# Patient Record
Sex: Female | Born: 1995 | Marital: Married
Health system: Southern US, Community
[De-identification: ages and names within clinical notes are randomized; demographics above are authoritative.]

## PROBLEM LIST (undated history)

## (undated) DIAGNOSIS — I10 Essential (primary) hypertension: Secondary | ICD-10-CM

## (undated) DIAGNOSIS — F329 Major depressive disorder, single episode, unspecified: Secondary | ICD-10-CM

## (undated) DIAGNOSIS — F32A Depression, unspecified: Secondary | ICD-10-CM

## (undated) DIAGNOSIS — J45909 Unspecified asthma, uncomplicated: Secondary | ICD-10-CM

---
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
## 2017-01-01 DIAGNOSIS — Z30432 Encounter for removal of intrauterine contraceptive device: Secondary | ICD-10-CM | POA: Diagnosis not present

## 2017-03-17 DIAGNOSIS — Z3A01 Less than 8 weeks gestation of pregnancy: Secondary | ICD-10-CM | POA: Diagnosis not present

## 2017-03-17 DIAGNOSIS — O200 Threatened abortion: Secondary | ICD-10-CM | POA: Diagnosis not present

## 2017-03-17 DIAGNOSIS — Z3A00 Weeks of gestation of pregnancy not specified: Secondary | ICD-10-CM | POA: Diagnosis not present

## 2017-03-17 DIAGNOSIS — O209 Hemorrhage in early pregnancy, unspecified: Secondary | ICD-10-CM | POA: Diagnosis not present

## 2017-03-20 DIAGNOSIS — Z3A00 Weeks of gestation of pregnancy not specified: Secondary | ICD-10-CM | POA: Diagnosis not present

## 2017-03-20 DIAGNOSIS — O200 Threatened abortion: Secondary | ICD-10-CM | POA: Diagnosis not present

## 2017-03-27 DIAGNOSIS — Z3A00 Weeks of gestation of pregnancy not specified: Secondary | ICD-10-CM | POA: Diagnosis not present

## 2017-03-27 DIAGNOSIS — O200 Threatened abortion: Secondary | ICD-10-CM | POA: Diagnosis not present

## 2017-05-14 DIAGNOSIS — R112 Nausea with vomiting, unspecified: Secondary | ICD-10-CM | POA: Diagnosis not present

## 2017-05-14 DIAGNOSIS — R350 Frequency of micturition: Secondary | ICD-10-CM | POA: Diagnosis not present

## 2017-05-14 DIAGNOSIS — D200 Benign neoplasm of soft tissue of retroperitoneum: Secondary | ICD-10-CM | POA: Diagnosis not present

## 2017-05-14 DIAGNOSIS — N912 Amenorrhea, unspecified: Secondary | ICD-10-CM | POA: Diagnosis not present

## 2017-05-18 DIAGNOSIS — Z3A01 Less than 8 weeks gestation of pregnancy: Secondary | ICD-10-CM | POA: Diagnosis not present

## 2017-05-18 DIAGNOSIS — Z3689 Encounter for other specified antenatal screening: Secondary | ICD-10-CM | POA: Diagnosis not present

## 2017-05-18 DIAGNOSIS — O3680X0 Pregnancy with inconclusive fetal viability, not applicable or unspecified: Secondary | ICD-10-CM | POA: Diagnosis not present

## 2017-06-01 DIAGNOSIS — Z369 Encounter for antenatal screening, unspecified: Secondary | ICD-10-CM | POA: Diagnosis not present

## 2017-06-01 DIAGNOSIS — N912 Amenorrhea, unspecified: Secondary | ICD-10-CM | POA: Diagnosis not present

## 2017-06-01 DIAGNOSIS — Z3689 Encounter for other specified antenatal screening: Secondary | ICD-10-CM | POA: Diagnosis not present

## 2017-06-01 DIAGNOSIS — Z3A09 9 weeks gestation of pregnancy: Secondary | ICD-10-CM | POA: Diagnosis not present

## 2017-07-27 DIAGNOSIS — Z3A17 17 weeks gestation of pregnancy: Secondary | ICD-10-CM | POA: Diagnosis not present

## 2017-07-27 DIAGNOSIS — N898 Other specified noninflammatory disorders of vagina: Secondary | ICD-10-CM | POA: Diagnosis not present

## 2017-07-27 DIAGNOSIS — Z363 Encounter for antenatal screening for malformations: Secondary | ICD-10-CM | POA: Diagnosis not present

## 2017-09-11 DIAGNOSIS — R509 Fever, unspecified: Secondary | ICD-10-CM | POA: Diagnosis not present

## 2017-09-11 DIAGNOSIS — J020 Streptococcal pharyngitis: Secondary | ICD-10-CM | POA: Diagnosis not present

## 2017-10-05 DIAGNOSIS — O99810 Abnormal glucose complicating pregnancy: Secondary | ICD-10-CM | POA: Diagnosis not present

## 2017-10-05 DIAGNOSIS — Z3A27 27 weeks gestation of pregnancy: Secondary | ICD-10-CM | POA: Diagnosis not present

## 2017-10-05 DIAGNOSIS — R7302 Impaired glucose tolerance (oral): Secondary | ICD-10-CM | POA: Diagnosis not present

## 2017-10-05 DIAGNOSIS — Z3689 Encounter for other specified antenatal screening: Secondary | ICD-10-CM | POA: Diagnosis not present

## 2017-10-05 DIAGNOSIS — Z23 Encounter for immunization: Secondary | ICD-10-CM | POA: Diagnosis not present

## 2017-10-22 DIAGNOSIS — R7302 Impaired glucose tolerance (oral): Secondary | ICD-10-CM | POA: Diagnosis not present

## 2017-11-29 DIAGNOSIS — Z3A34 34 weeks gestation of pregnancy: Secondary | ICD-10-CM | POA: Diagnosis not present

## 2017-11-29 DIAGNOSIS — Z113 Encounter for screening for infections with a predominantly sexual mode of transmission: Secondary | ICD-10-CM | POA: Diagnosis not present

## 2017-11-29 DIAGNOSIS — Z3A35 35 weeks gestation of pregnancy: Secondary | ICD-10-CM | POA: Diagnosis not present

## 2017-11-29 DIAGNOSIS — O24410 Gestational diabetes mellitus in pregnancy, diet controlled: Secondary | ICD-10-CM | POA: Diagnosis not present

## 2017-12-25 DIAGNOSIS — O34211 Maternal care for low transverse scar from previous cesarean delivery: Secondary | ICD-10-CM | POA: Diagnosis not present

## 2017-12-25 DIAGNOSIS — Z3A39 39 weeks gestation of pregnancy: Secondary | ICD-10-CM | POA: Diagnosis not present

## 2017-12-25 DIAGNOSIS — O24420 Gestational diabetes mellitus in childbirth, diet controlled: Secondary | ICD-10-CM | POA: Diagnosis not present

## 2018-01-28 DIAGNOSIS — D485 Neoplasm of uncertain behavior of skin: Secondary | ICD-10-CM | POA: Diagnosis not present

## 2018-01-28 DIAGNOSIS — L820 Inflamed seborrheic keratosis: Secondary | ICD-10-CM | POA: Diagnosis not present

## 2018-01-28 DIAGNOSIS — L918 Other hypertrophic disorders of the skin: Secondary | ICD-10-CM | POA: Diagnosis not present

## 2018-01-28 DIAGNOSIS — L578 Other skin changes due to chronic exposure to nonionizing radiation: Secondary | ICD-10-CM | POA: Diagnosis not present

## 2018-01-28 DIAGNOSIS — D224 Melanocytic nevi of scalp and neck: Secondary | ICD-10-CM | POA: Diagnosis not present

## 2018-02-25 DIAGNOSIS — L820 Inflamed seborrheic keratosis: Secondary | ICD-10-CM | POA: Diagnosis not present

## 2018-03-13 DIAGNOSIS — Z3202 Encounter for pregnancy test, result negative: Secondary | ICD-10-CM | POA: Diagnosis not present

## 2018-03-13 DIAGNOSIS — Z131 Encounter for screening for diabetes mellitus: Secondary | ICD-10-CM | POA: Diagnosis not present

## 2018-03-13 DIAGNOSIS — Z8632 Personal history of gestational diabetes: Secondary | ICD-10-CM | POA: Diagnosis not present

## 2018-03-13 DIAGNOSIS — Z30430 Encounter for insertion of intrauterine contraceptive device: Secondary | ICD-10-CM | POA: Diagnosis not present

## 2018-05-01 DIAGNOSIS — Z30431 Encounter for routine checking of intrauterine contraceptive device: Secondary | ICD-10-CM | POA: Diagnosis not present

## 2018-11-19 DIAGNOSIS — M79605 Pain in left leg: Secondary | ICD-10-CM | POA: Diagnosis not present

## 2018-11-19 DIAGNOSIS — Z6828 Body mass index (BMI) 28.0-28.9, adult: Secondary | ICD-10-CM | POA: Diagnosis not present

## 2018-11-22 ENCOUNTER — Ambulatory Visit (HOSPITAL_BASED_OUTPATIENT_CLINIC_OR_DEPARTMENT_OTHER): Admission: RE | Admit: 2018-11-22 | Payer: BC Managed Care – PPO | Source: Ambulatory Visit

## 2018-11-22 ENCOUNTER — Other Ambulatory Visit (HOSPITAL_BASED_OUTPATIENT_CLINIC_OR_DEPARTMENT_OTHER): Payer: Self-pay | Admitting: Family

## 2018-11-22 DIAGNOSIS — I83813 Varicose veins of bilateral lower extremities with pain: Secondary | ICD-10-CM

## 2018-11-22 DIAGNOSIS — M79605 Pain in left leg: Secondary | ICD-10-CM

## 2018-12-04 DIAGNOSIS — Z01419 Encounter for gynecological examination (general) (routine) without abnormal findings: Secondary | ICD-10-CM | POA: Diagnosis not present

## 2018-12-04 DIAGNOSIS — F3289 Other specified depressive episodes: Secondary | ICD-10-CM | POA: Diagnosis not present

## 2018-12-06 ENCOUNTER — Other Ambulatory Visit: Payer: Self-pay | Admitting: Obstetrics and Gynecology

## 2018-12-06 DIAGNOSIS — I8312 Varicose veins of left lower extremity with inflammation: Secondary | ICD-10-CM

## 2018-12-06 DIAGNOSIS — I8311 Varicose veins of right lower extremity with inflammation: Secondary | ICD-10-CM

## 2018-12-26 ENCOUNTER — Other Ambulatory Visit: Payer: BC Managed Care – PPO

## 2018-12-26 ENCOUNTER — Ambulatory Visit
Admission: RE | Admit: 2018-12-26 | Discharge: 2018-12-26 | Disposition: A | Discharge status: Home / Self Care | Payer: BC Managed Care – PPO | Source: Ambulatory Visit | Attending: Obstetrics and Gynecology | Admitting: Obstetrics and Gynecology

## 2018-12-26 DIAGNOSIS — I8311 Varicose veins of right lower extremity with inflammation: Secondary | ICD-10-CM

## 2018-12-26 DIAGNOSIS — I8312 Varicose veins of left lower extremity with inflammation: Secondary | ICD-10-CM | POA: Diagnosis not present

## 2018-12-26 HISTORY — DX: Depression, unspecified: F32.A

## 2018-12-26 HISTORY — DX: Unspecified asthma, uncomplicated: J45.909

## 2018-12-26 HISTORY — DX: Essential (primary) hypertension: I10

## 2018-12-26 NOTE — Consult Note (Signed)
Chief Complaint: Patient was seen in consultation today for symptomatic lower extremity varicose veins at the request of Richardson,Cris R  Referring Physician(s): Richardson,Cris R  History of Present Illness: Alyssa Hood is a 23 y.o. female G3P2 who noted progressive painful ultimately varicose veins left greater than right exacerbated after her most recent pregnancy.  She has had no previous treatment for varicose or spider veins.  No family history or personal history of pulmonary embolus, DVT, or superficial thrombophlebitis.  There is a positive family history of varicose/spider veins.  She did feel that thigh-high graduated compression hose help with her symptoms although she developed toe lesion requiring discontinuation of use of the graduated compression hose.  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  She is not use any pain medications for her vein symptoms.  She is not using leg elevation for vein symptoms.  No past medical history on file.  Past Surgical History:  Procedure Laterality Date  . CESAREAN SECTION      Allergies: Patient has no allergy information on record.  Medications: Prior to Admission medications   Not on File     No family history on file.  Social History   Socioeconomic History  . Marital status: Married    Spouse name: Not on file  . Number of children: Not on file  . Years of education: Not on file  . Highest education level: Not on file  Occupational History  . Not on file  Social Needs  . Financial resource strain: Not on file  . Food insecurity    Worry: Not on file    Inability: Not on file  . Transportation needs    Medical: Not on file    Non-medical: Not on file  Tobacco Use  . Smoking status: Not on file  Substance and Sexual Activity  . Alcohol use: Not on file  . Drug use: Not on file  . Sexual activity: Not on file  Lifestyle  . Physical activity    Days per week: Not  on file    Minutes per session: Not on file  . Stress: Not on file  Relationships  . Social Herbalist on phone: Not on file    Gets together: Not on file    Attends religious service: Not on file    Active member of club or organization: Not on file    Attends meetings of clubs or organizations: Not on file    Relationship status: Not on file  Other Topics Concern  . Not on file  Social History Narrative  . Not on file    ECOG Status: 1 - Symptomatic but completely ambulatory Physical Exam Skin:   Vital Signs: BP 129/81 (BP Location: Right Arm)   Pulse 71   Temp 98.4 F (36.9 C)   SpO2 97%   Constitutional: Oriented to person, place, and time. Well-developed and well-nourished. No distress.   HENT:  Head: Normocephalic and atraumatic.  Eyes: Conjunctivae and EOM are normal. Right eye exhibits no discharge. Left eye exhibits no discharge. No scleral icterus.  Neck: No JVD present.  Pulmonary/Chest: Effort normal. No stridor. No respiratory distress.  Abdomen: soft, non distended Neurological:  alert and oriented to person, place, and time.  Skin: Skin is warm and dry.  not diaphoretic.  Visible anterior thigh varicose veins, on the left extending across the knee.  No skin trophic changes, rubor,  or ulceration.  No ankle edema.     Psychiatric:   normal mood and affect.   behavior is normal. Judgment and thought content normal.   Mallampati Score: Review of Systems Review of Systems: A 12 point ROS discussed and pertinent positives are indicated in the HPI above.  All other systems are negative.      Imaging: [facility] Venous Img Lower Bilateral  Result Date: 12/26/2018 CLINICAL DATA:  Anterior thigh symptomatic varicose veins, left worse than right. EXAM: BILATERAL LOWER EXTREMITY VENOUS DOPPLER ULTRASOUND TECHNIQUE: Gray-scale sonography with graded compression, as well as color Doppler and duplex ultrasound, were performed to evaluate the deep and superficial  veins of both lower extremities. Spectral Doppler was utilized to evaluate flow at rest and with distal augmentation maneuvers. A complete superficial venous insufficiency exam was performed in the upright standing position. I personally performed the technical portion of the exam. COMPARISON:  None. FINDINGS: Deep Venous System: Evaluation of the deep venous systems including the common femoral, femoral, profunda femoral, popliteal and calf veins (where visible) demonstrate no evidence of deep venous thrombosis. The vessels are compressible and demonstrate normal respiratory phasicity and response to augmentation. No evidence of the deep venous reflux. Superficial Venous System: RIGHT SFJ: Patent, dilated up to 9 mm diameter, no reflux post augmentation There is a prominent anterolateral accessory great saphenous vein which measures up to 7 mm in diameter in the proximal thigh, 4 mm mid thigh. This shows greater than 6 seconds reflux post augmentation. This shows direct communication to the tortuous superficial symptomatic distal anterior thigh varicose veins . GSV Prox Thigh: Normal in caliber, no reflux. GSV Mid Thigh: Negative GSV Lower Thigh: Negative GSV at Knee: Negative GSV Prox Calf: Negative GSV Mid Calf: Negative GSV Distal Calf: Negative SPJ: Not visualized SSV Prox: Negative SSV Mid: Negative SSV Distal: Negative LEFT SFJ: Patent, dilated up to 1.4 cm diameter, no reflux post augmentation. Dilated anterolateral accessory great saphenous vein supplying anterior thigh and knee superficial varicose veins. This measures 6 mm in diameter in the proximal thigh and as a fairly linear course to the distal thigh right measures 3 mm diameter, more tortuous inferiorly. This shows greater than 6 seconds reflux post augmentation throughout its length. GSV Prox Thigh: Normal in caliber, no reflux. GSV Mid Thigh: Negative GSV Lower Thigh: Negative GSV at Knee: Negative GSV Prox Calf: Negative GSV Mid Calf: Negative  GSV Distal Calf: Negative SPJ: Not visualized SSV Prox: Negative SSV Mid: Negative SSV Distal: Negative Other: None IMPRESSION: 1. Normal bilateral lower extremity deep venous systems. No evidence of acute or chronic DVT. 2. 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 3. The remainder of bilateral saphenous venous systems normal in caliber with no evidence of valvular incompetence or reflux. Electronically Signed   By: Corlis Leak  Shanterria Franta M.D.   On: 12/26/2018 13:14   [facility]  Result Date: 12/26/2018 Please refer to "Notes" to see consult details.   Labs:  CBC: No results for input(s): WBC, HGB, HCT, PLT in the last 8760 hours.  COAGS: No results for input(s): INR, APTT in the last 8760 hours.  BMP: No results for input(s): NA, K, CL, CO2, GLUCOSE, BUN, CALCIUM, CREATININE, GFRNONAA, GFRAA in the last 8760 hours.  Invalid input(s): CMP  LIVER FUNCTION TESTS: No results for input(s): BILITOT, AST, ALT, ALKPHOS, PROT, ALBUMIN in the last 8760 hours.  TUMOR MARKERS: No results for input(s):  AFPTM, CEA, CA199, CHROMGRNA in the last 8760 hours.  Assessment and Plan:  My impression is that this patient has bilateral refluxing accessory anterolateral great saphenous veins supplying her symptomatic anterior thigh varicose veins.  This is more extensive on the left, extending across the knee.  On the left, there is a relatively straight segment refluxing from the saphenofemoral junction to the distal thigh, more tortuous inferiorly. Her right side is currently minimally symptomatic.  We can follow this and if it progresses consider treatment at a later date.  She is an appropriate candidate for consideration of venous ablation of this refluxing left segment, with high anticipation of symptom relief.  We reviewed the pathophysiology of saphenous venous valvular incompetence, reflux,  and varicose veins.  We discussed treatment options including conservative treatment (compression hose, elevation), direct injection sclerotherapy, endovenous ablation, surgical ligation/stripping.  We discussed in detail the percutaneous ultrasound-guided endovenous ablation technique, anticipated benefits, time course of symptom resolution, anticipated durable result, potential complications and side effects, and need for post procedure use of thigh-high graduated compression hose.  She seemed to understand and did ask appropriate questions.  She seems motivated to proceed.  Accordingly, we can set this up at her convenience, pending carrier approval. We did reiterate the need for long-term use of graduated thigh-high compression hose when up and about post procedure, to minimize risk of development of progressive varicose veins at other sites in her lower extremities. Thank you for this interesting consult.  I greatly enjoyed meeting Alyssa Hood and look forward to participating in their care.  A copy of this report was sent to the requesting provider on this date.  Electronically Signed: Durwin Glazeayne Percy Comp 12/26/2018, 1:51 PM   I spent a total of  40 Minutes   in face to face in clinical consultation, greater than 50% of which was counseling/coordinating care for symptomatic bilateral lower extremity varicose veins.

## 2019-01-16 DIAGNOSIS — F3289 Other specified depressive episodes: Secondary | ICD-10-CM | POA: Diagnosis not present

## 2019-01-27 ENCOUNTER — Other Ambulatory Visit: Payer: Self-pay | Admitting: Interventional Radiology

## 2019-02-03 ENCOUNTER — Other Ambulatory Visit: Payer: Self-pay | Admitting: Interventional Radiology

## 2019-02-03 DIAGNOSIS — I872 Venous insufficiency (chronic) (peripheral): Secondary | ICD-10-CM

## 2019-02-13 DIAGNOSIS — L301 Dyshidrosis [pompholyx]: Secondary | ICD-10-CM | POA: Diagnosis not present

## 2019-04-08 ENCOUNTER — Ambulatory Visit: Payer: BC Managed Care – PPO

## 2019-04-08 ENCOUNTER — Other Ambulatory Visit: Payer: BC Managed Care – PPO

## 2019-05-06 DIAGNOSIS — Z20828 Contact with and (suspected) exposure to other viral communicable diseases: Secondary | ICD-10-CM | POA: Diagnosis not present

## 2019-08-18 DIAGNOSIS — A084 Viral intestinal infection, unspecified: Secondary | ICD-10-CM | POA: Diagnosis not present

## 2019-08-18 DIAGNOSIS — Z20822 Contact with and (suspected) exposure to covid-19: Secondary | ICD-10-CM | POA: Diagnosis not present

## 2019-08-18 DIAGNOSIS — R112 Nausea with vomiting, unspecified: Secondary | ICD-10-CM | POA: Diagnosis not present

## 2019-08-18 DIAGNOSIS — R197 Diarrhea, unspecified: Secondary | ICD-10-CM | POA: Diagnosis not present

## 2019-11-19 IMAGING — US VENOUS DOPPLER ULTRASOUND OF  LOWER EXTREMITIES
1 series · 13 of 24 positions shown · non-contrast
Comparison: None.

CLINICAL DATA: Anterior thigh symptomatic varicose veins, left
worse than right.

EXAM:
BILATERAL LOWER EXTREMITY VENOUS DOPPLER ULTRASOUND
TECHNIQUE: Gray-scale sonography with graded compression, as well as color
Doppler and duplex ultrasound, were performed to evaluate the deep
and superficial veins of both lower extremities. Spectral Doppler
was utilized to evaluate flow at rest and with distal augmentation
maneuvers. A complete superficial venous insufficiency exam was
performed in the upright standing position. I personally performed
the technical portion of the exam.

[Series 1: venous doppler ultrasound of lower extremities · 0.06mm/px · 13 of 67 slices shown]
[im 1/67]
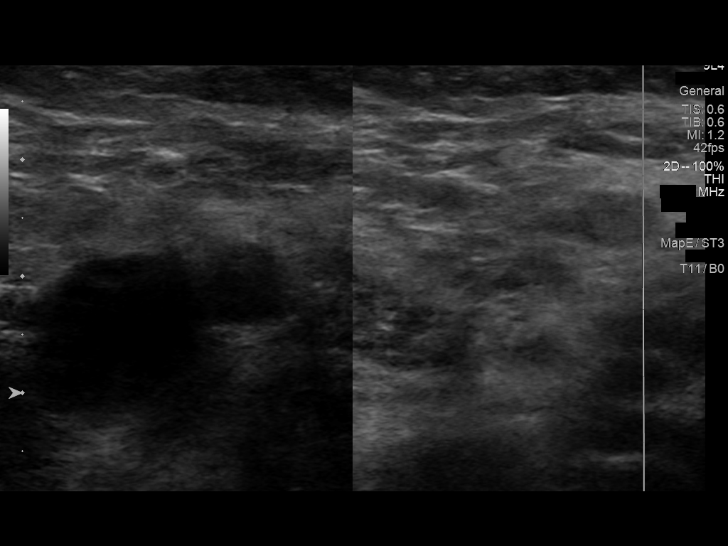
[im 6/67]
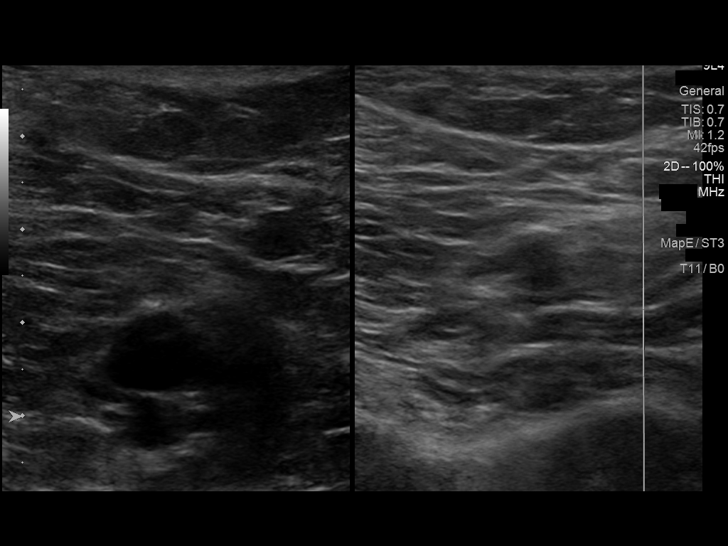
[im 12/67]
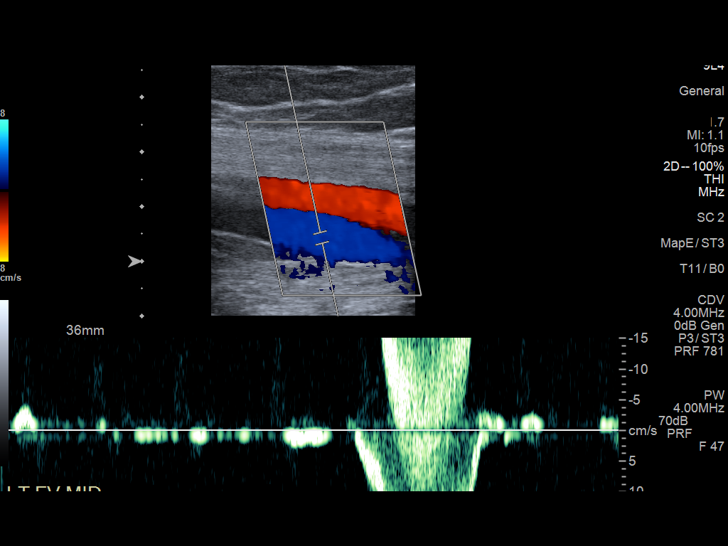
[im 18/67]
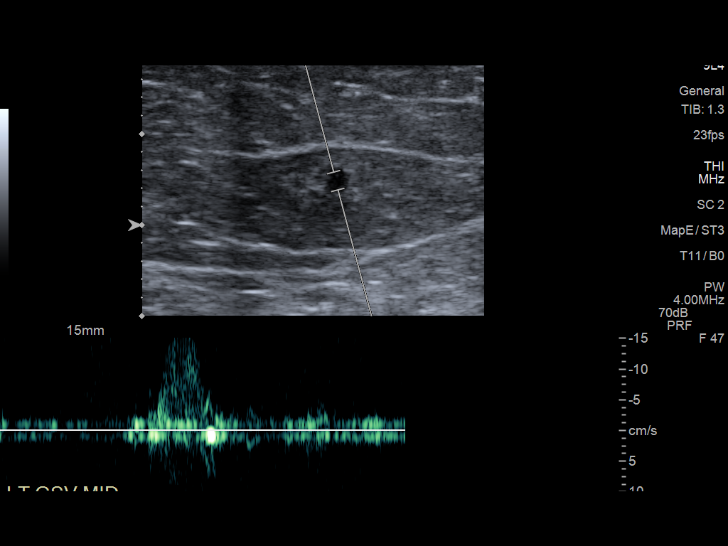
[im 23/67]
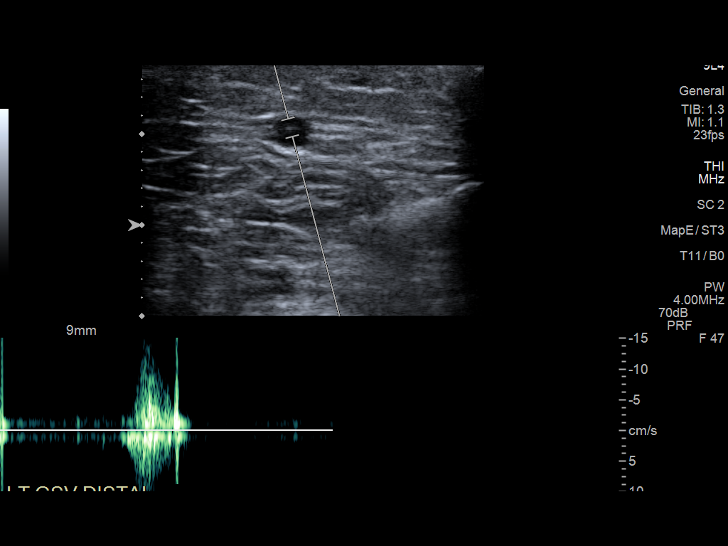
[im 29/67]
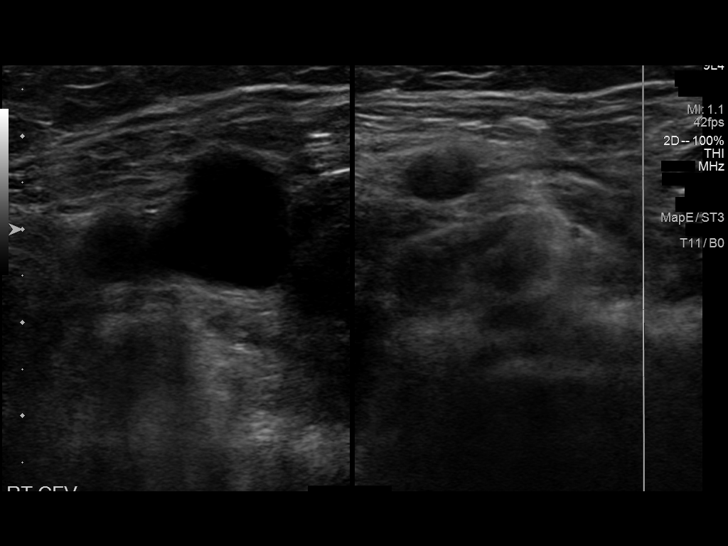
[im 35/67]
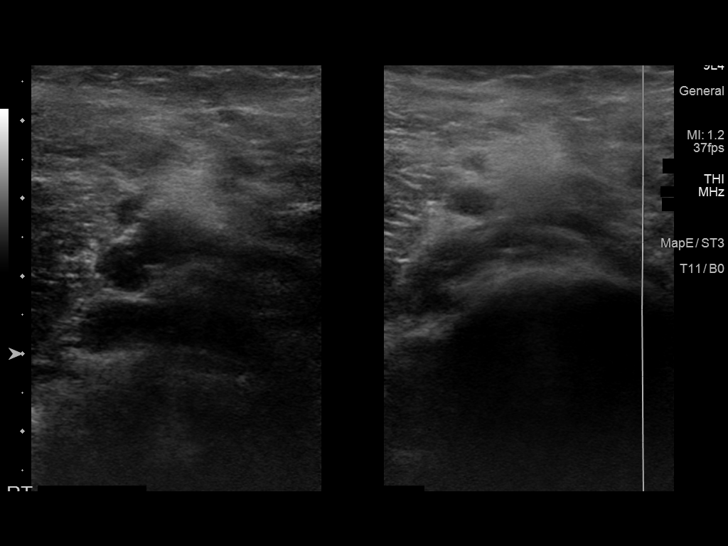
[im 38/67]
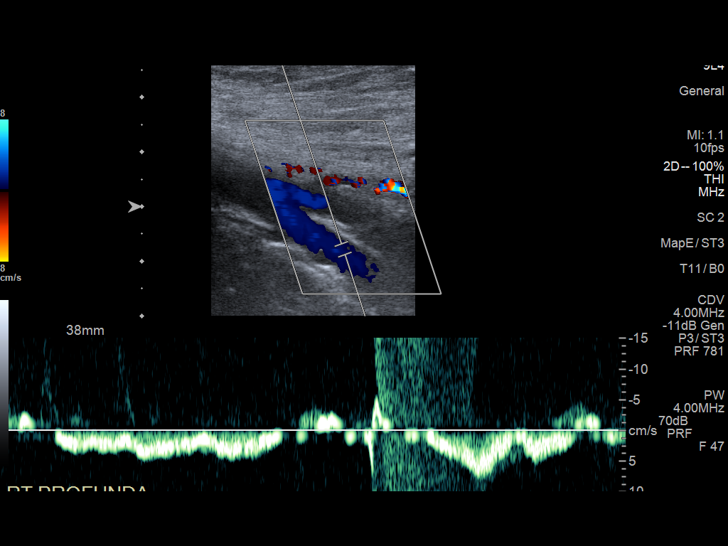
[im 44/67]
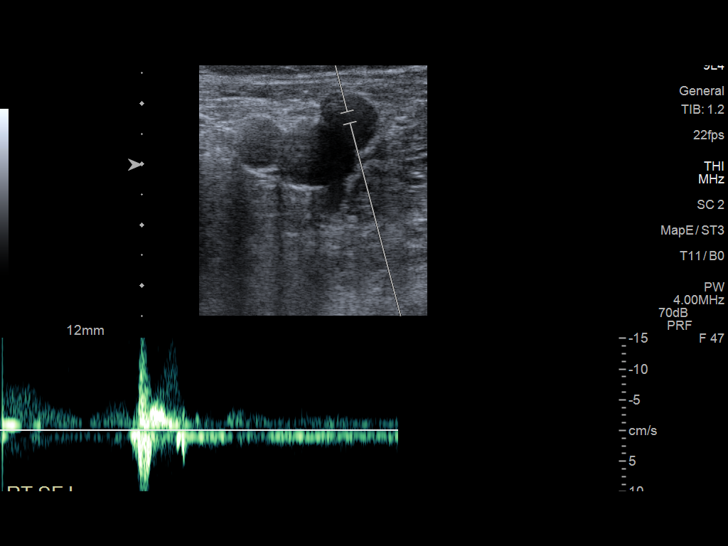
[im 49/67]
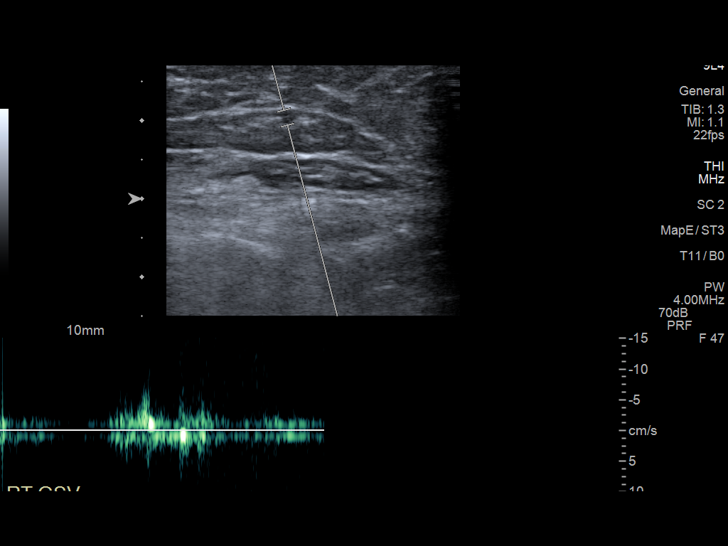
[im 55/67]
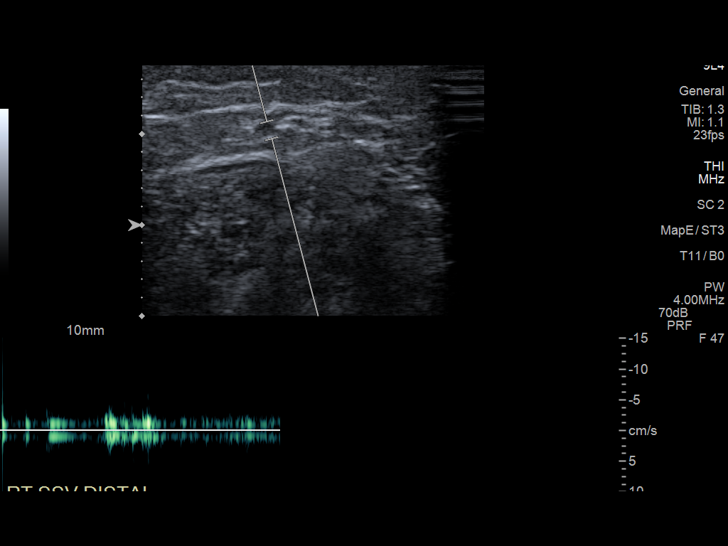
[im 61/67]
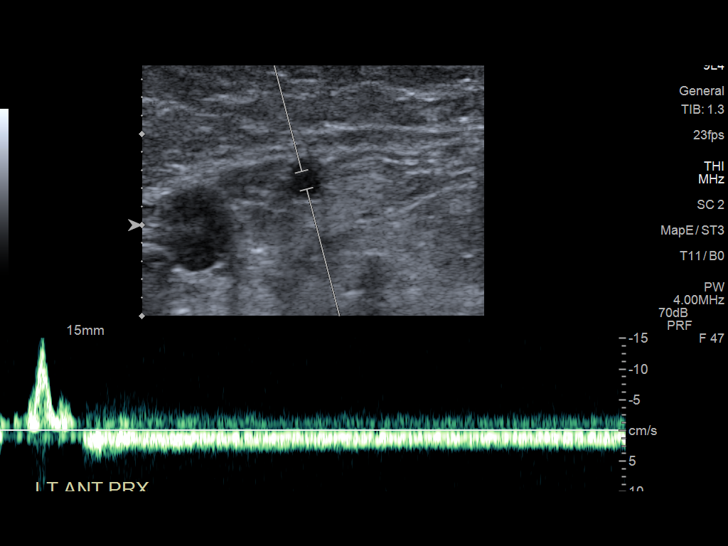
[im 67/67]
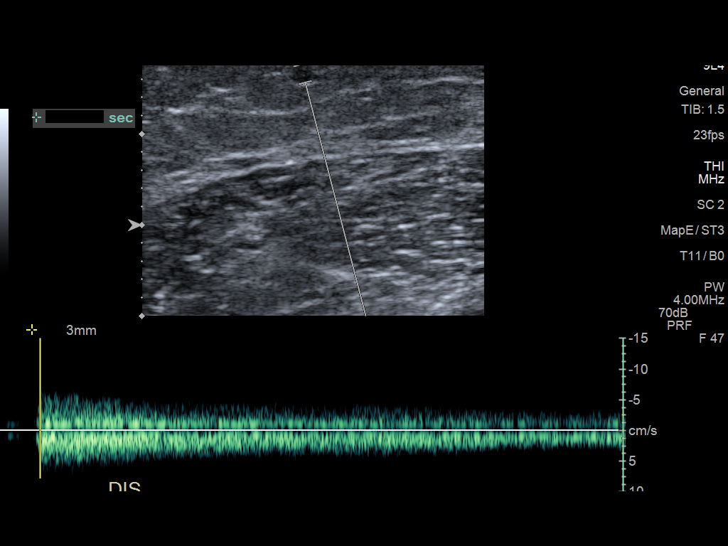

[13 of 24 positions shown; findings below may reference images not displayed]

FINDINGS: Deep Venous System:

Evaluation of the deep venous systems including the common femoral,
femoral, profunda femoral, popliteal and calf veins (where visible)
demonstrate no evidence of deep venous thrombosis. The vessels are
compressible and demonstrate normal respiratory phasicity and
response to augmentation. No evidence of the deep venous reflux.

Superficial Venous System:

RIGHT

SFJ: Patent, dilated up to 9 mm diameter, no reflux post
augmentation

There is a prominent anterolateral accessory great saphenous vein
which measures up to 7 mm in diameter in the proximal thigh, 4 mm
mid thigh. This shows greater than 6 seconds reflux post
augmentation. This shows direct communication to the tortuous
superficial symptomatic distal anterior thigh varicose veins .

GSV Prox Thigh: Normal in caliber, no reflux.

GSV Mid Thigh: Negative

GSV Lower Thigh: Negative

GSV at Knee: Negative

GSV Prox Calf: Negative

GSV Mid Calf: Negative

GSV Distal Calf: Negative

SPJ: Not visualized

SSV Prox: Negative

SSV Mid: Negative

SSV Distal: Negative

LEFT

SFJ: Patent, dilated up to 1.4 cm diameter, no reflux post
augmentation.

Dilated anterolateral accessory great saphenous vein supplying
anterior thigh and knee superficial varicose veins. This measures 6
mm in diameter in the proximal thigh and as a fairly linear course
to the distal thigh right measures 3 mm diameter, more tortuous
inferiorly. This shows greater than 6 seconds reflux post
augmentation throughout its length.

GSV Prox Thigh: Normal in caliber, no reflux.

GSV Mid Thigh: Negative

GSV Lower Thigh: Negative

GSV at Knee: Negative

GSV Prox Calf: Negative

GSV Mid Calf: Negative

GSV Distal Calf: Negative

SPJ: Not visualized

SSV Prox: Negative

SSV Mid: Negative

SSV Distal: Negative

Other: None
IMPRESSION: 1. Normal bilateral lower extremity deep venous systems. No evidence
of acute or chronic DVT.
2. Bilateral refluxing anterolateral accessory great saphenous veins
with direct communication to the patient's symptomatic anterior
thigh varicose veins.
3. The remainder of bilateral saphenous venous systems normal in
caliber with no evidence of valvular incompetence or reflux.

## 2020-01-21 DIAGNOSIS — Z01419 Encounter for gynecological examination (general) (routine) without abnormal findings: Secondary | ICD-10-CM | POA: Diagnosis not present

## 2020-01-22 DIAGNOSIS — L490 Exfoliation due to erythematous condition involving less than 10 percent of body surface: Secondary | ICD-10-CM | POA: Diagnosis not present

## 2020-01-22 DIAGNOSIS — I83813 Varicose veins of bilateral lower extremities with pain: Secondary | ICD-10-CM | POA: Diagnosis not present

## 2020-01-22 DIAGNOSIS — R600 Localized edema: Secondary | ICD-10-CM | POA: Diagnosis not present

## 2020-02-09 DIAGNOSIS — I83813 Varicose veins of bilateral lower extremities with pain: Secondary | ICD-10-CM | POA: Diagnosis not present

## 2020-03-04 DIAGNOSIS — L490 Exfoliation due to erythematous condition involving less than 10 percent of body surface: Secondary | ICD-10-CM | POA: Diagnosis not present

## 2020-03-04 DIAGNOSIS — R600 Localized edema: Secondary | ICD-10-CM | POA: Diagnosis not present

## 2020-03-04 DIAGNOSIS — I83813 Varicose veins of bilateral lower extremities with pain: Secondary | ICD-10-CM | POA: Diagnosis not present

## 2020-05-27 DIAGNOSIS — M545 Low back pain, unspecified: Secondary | ICD-10-CM | POA: Diagnosis not present

## 2020-05-27 DIAGNOSIS — R61 Generalized hyperhidrosis: Secondary | ICD-10-CM | POA: Diagnosis not present

## 2020-05-27 DIAGNOSIS — M546 Pain in thoracic spine: Secondary | ICD-10-CM | POA: Diagnosis not present

## 2020-05-27 DIAGNOSIS — Z20822 Contact with and (suspected) exposure to covid-19: Secondary | ICD-10-CM | POA: Diagnosis not present

## 2020-06-18 DIAGNOSIS — D72829 Elevated white blood cell count, unspecified: Secondary | ICD-10-CM | POA: Diagnosis not present

## 2020-06-23 DIAGNOSIS — Z1152 Encounter for screening for COVID-19: Secondary | ICD-10-CM | POA: Diagnosis not present

## 2020-11-24 DIAGNOSIS — M545 Low back pain, unspecified: Secondary | ICD-10-CM | POA: Diagnosis not present

## 2020-11-24 DIAGNOSIS — Z6829 Body mass index (BMI) 29.0-29.9, adult: Secondary | ICD-10-CM | POA: Diagnosis not present

## 2020-11-24 DIAGNOSIS — M546 Pain in thoracic spine: Secondary | ICD-10-CM | POA: Diagnosis not present

## 2020-12-01 DIAGNOSIS — M546 Pain in thoracic spine: Secondary | ICD-10-CM | POA: Diagnosis not present

## 2020-12-01 DIAGNOSIS — M545 Low back pain, unspecified: Secondary | ICD-10-CM | POA: Diagnosis not present

## 2020-12-24 DIAGNOSIS — M5416 Radiculopathy, lumbar region: Secondary | ICD-10-CM | POA: Diagnosis not present

## 2020-12-24 DIAGNOSIS — F411 Generalized anxiety disorder: Secondary | ICD-10-CM | POA: Diagnosis not present

## 2020-12-24 DIAGNOSIS — Z6828 Body mass index (BMI) 28.0-28.9, adult: Secondary | ICD-10-CM | POA: Diagnosis not present

## 2021-01-05 DIAGNOSIS — Z1152 Encounter for screening for COVID-19: Secondary | ICD-10-CM | POA: Diagnosis not present

## 2021-01-05 DIAGNOSIS — B349 Viral infection, unspecified: Secondary | ICD-10-CM | POA: Diagnosis not present

## 2021-01-24 DIAGNOSIS — Z01419 Encounter for gynecological examination (general) (routine) without abnormal findings: Secondary | ICD-10-CM | POA: Diagnosis not present

## 2021-02-03 DIAGNOSIS — R600 Localized edema: Secondary | ICD-10-CM | POA: Diagnosis not present

## 2021-02-03 DIAGNOSIS — I83813 Varicose veins of bilateral lower extremities with pain: Secondary | ICD-10-CM | POA: Diagnosis not present

## 2021-02-12 DIAGNOSIS — J029 Acute pharyngitis, unspecified: Secondary | ICD-10-CM | POA: Diagnosis not present

## 2021-02-21 DIAGNOSIS — M6281 Muscle weakness (generalized): Secondary | ICD-10-CM | POA: Diagnosis not present

## 2021-02-21 DIAGNOSIS — M545 Low back pain, unspecified: Secondary | ICD-10-CM | POA: Diagnosis not present

## 2021-02-28 DIAGNOSIS — M6281 Muscle weakness (generalized): Secondary | ICD-10-CM | POA: Diagnosis not present

## 2021-02-28 DIAGNOSIS — M545 Low back pain, unspecified: Secondary | ICD-10-CM | POA: Diagnosis not present

## 2021-03-07 DIAGNOSIS — Z30433 Encounter for removal and reinsertion of intrauterine contraceptive device: Secondary | ICD-10-CM | POA: Diagnosis not present

## 2021-03-07 DIAGNOSIS — Z3202 Encounter for pregnancy test, result negative: Secondary | ICD-10-CM | POA: Diagnosis not present

## 2021-03-16 DIAGNOSIS — I83812 Varicose veins of left lower extremities with pain: Secondary | ICD-10-CM | POA: Diagnosis not present

## 2021-03-16 DIAGNOSIS — I872 Venous insufficiency (chronic) (peripheral): Secondary | ICD-10-CM | POA: Diagnosis not present

## 2021-03-16 DIAGNOSIS — L490 Exfoliation due to erythematous condition involving less than 10 percent of body surface: Secondary | ICD-10-CM | POA: Diagnosis not present

## 2021-03-21 DIAGNOSIS — M545 Low back pain, unspecified: Secondary | ICD-10-CM | POA: Diagnosis not present

## 2021-03-21 DIAGNOSIS — M6281 Muscle weakness (generalized): Secondary | ICD-10-CM | POA: Diagnosis not present

## 2021-04-06 DIAGNOSIS — J069 Acute upper respiratory infection, unspecified: Secondary | ICD-10-CM | POA: Diagnosis not present

## 2021-04-18 DIAGNOSIS — Z30431 Encounter for routine checking of intrauterine contraceptive device: Secondary | ICD-10-CM | POA: Diagnosis not present

## 2021-06-09 DIAGNOSIS — R6889 Other general symptoms and signs: Secondary | ICD-10-CM | POA: Diagnosis not present

## 2021-06-09 DIAGNOSIS — J020 Streptococcal pharyngitis: Secondary | ICD-10-CM | POA: Diagnosis not present

## 2021-12-13 DIAGNOSIS — F411 Generalized anxiety disorder: Secondary | ICD-10-CM | POA: Diagnosis not present

## 2021-12-13 DIAGNOSIS — Z6830 Body mass index (BMI) 30.0-30.9, adult: Secondary | ICD-10-CM | POA: Diagnosis not present

## 2021-12-13 DIAGNOSIS — Z1331 Encounter for screening for depression: Secondary | ICD-10-CM | POA: Diagnosis not present

## 2022-08-09 DIAGNOSIS — Z20822 Contact with and (suspected) exposure to covid-19: Secondary | ICD-10-CM | POA: Diagnosis not present

## 2022-08-09 DIAGNOSIS — U071 COVID-19: Secondary | ICD-10-CM | POA: Diagnosis not present

## 2022-08-09 DIAGNOSIS — R197 Diarrhea, unspecified: Secondary | ICD-10-CM | POA: Diagnosis not present

## 2022-08-09 DIAGNOSIS — R110 Nausea: Secondary | ICD-10-CM | POA: Diagnosis not present

## 2022-09-27 DIAGNOSIS — Z01419 Encounter for gynecological examination (general) (routine) without abnormal findings: Secondary | ICD-10-CM | POA: Diagnosis not present

## 2022-09-27 DIAGNOSIS — Z124 Encounter for screening for malignant neoplasm of cervix: Secondary | ICD-10-CM | POA: Diagnosis not present

## 2022-09-27 DIAGNOSIS — Z30431 Encounter for routine checking of intrauterine contraceptive device: Secondary | ICD-10-CM | POA: Diagnosis not present

## 2023-01-03 DIAGNOSIS — M62838 Other muscle spasm: Secondary | ICD-10-CM | POA: Diagnosis not present

## 2023-01-03 DIAGNOSIS — Z1331 Encounter for screening for depression: Secondary | ICD-10-CM | POA: Diagnosis not present

## 2023-01-03 DIAGNOSIS — F411 Generalized anxiety disorder: Secondary | ICD-10-CM | POA: Diagnosis not present

## 2023-02-08 DIAGNOSIS — S91311A Laceration without foreign body, right foot, initial encounter: Secondary | ICD-10-CM | POA: Diagnosis not present

## 2024-02-20 DIAGNOSIS — F411 Generalized anxiety disorder: Secondary | ICD-10-CM | POA: Diagnosis not present

## 2024-02-20 DIAGNOSIS — L02512 Cutaneous abscess of left hand: Secondary | ICD-10-CM | POA: Diagnosis not present

## 2024-02-24 DIAGNOSIS — S61452A Open bite of left hand, initial encounter: Secondary | ICD-10-CM | POA: Diagnosis not present

## 2024-02-24 DIAGNOSIS — L0291 Cutaneous abscess, unspecified: Secondary | ICD-10-CM | POA: Diagnosis not present

## 2024-02-24 DIAGNOSIS — W57XXXA Bitten or stung by nonvenomous insect and other nonvenomous arthropods, initial encounter: Secondary | ICD-10-CM | POA: Diagnosis not present

## 2024-03-04 DIAGNOSIS — Z30431 Encounter for routine checking of intrauterine contraceptive device: Secondary | ICD-10-CM | POA: Diagnosis not present

## 2024-03-04 DIAGNOSIS — Z01419 Encounter for gynecological examination (general) (routine) without abnormal findings: Secondary | ICD-10-CM | POA: Diagnosis not present

## 2024-03-11 DIAGNOSIS — Z30433 Encounter for removal and reinsertion of intrauterine contraceptive device: Secondary | ICD-10-CM | POA: Diagnosis not present

## 2024-04-22 DIAGNOSIS — Z30430 Encounter for insertion of intrauterine contraceptive device: Secondary | ICD-10-CM | POA: Diagnosis not present

## 2024-04-22 DIAGNOSIS — Z30431 Encounter for routine checking of intrauterine contraceptive device: Secondary | ICD-10-CM | POA: Diagnosis not present
